# Patient Record
Sex: Female | Born: 1995 | Race: White | Hispanic: No | Marital: Single | Smoking: Never smoker
Health system: Southern US, Community
[De-identification: ages and names within clinical notes are randomized; demographics above are authoritative.]

---

## 2017-03-04 ENCOUNTER — Encounter: Payer: Self-pay | Admitting: Emergency Medicine

## 2017-03-04 ENCOUNTER — Other Ambulatory Visit: Payer: Self-pay

## 2017-03-04 ENCOUNTER — Emergency Department
Admission: EM | Admit: 2017-03-04 | Discharge: 2017-03-04 | Disposition: A | Discharge status: Home / Self Care | Payer: Medicaid Other | Source: Home / Self Care | Attending: Family Medicine | Admitting: Family Medicine

## 2017-03-04 DIAGNOSIS — Z3201 Encounter for pregnancy test, result positive: Secondary | ICD-10-CM

## 2017-03-04 LAB — POCT URINE PREGNANCY: PREG TEST UR: POSITIVE — AB

## 2017-03-04 NOTE — ED Provider Notes (Signed)
[facility]    CSN: [id number] Arrival date & time: 03/04/17  1551     History   Chief Complaint Chief Complaint  Patient presents with  . Possible Pregnancy    HPI Sherry Klein is a 21 y.o. female.   Patient has had three positive pregnancy tests at home and wants to confirm.  Patient's last menstrual period was 01/26/2017 (exact date).  She denies abdominal pain, vaginal discharge, vaginal bleeding, fever, nausea, etc.   The history is provided by the patient.    History reviewed. No pertinent past medical history.  There are no active problems to display for this patient.   History reviewed. No pertinent surgical history.  OB History    Gravida Para Term Preterm AB Living   1             SAB TAB Ectopic Multiple Live Births                   Home Medications    Prior to Admission medications   Not on File    Family History No family history on file.  Social History Social History   Tobacco Use  . Smoking status: Never Smoker  . Smokeless tobacco: Never Used  Substance Use Topics  . Alcohol use: No    Frequency: Never  . Drug use: No     Allergies   Patient has no known allergies.   Review of Systems Review of Systems  Constitutional: Negative for activity change, appetite change, chills, diaphoresis, fatigue and fever.  HENT: Negative.   Eyes: Negative.   Respiratory: Negative.   Cardiovascular: Negative.   Gastrointestinal: Negative for abdominal pain, nausea and vomiting.  Genitourinary: Negative.   Musculoskeletal: Negative.   Skin: Negative.   Neurological: Negative for headaches.     Physical Exam Triage Vital Signs ED Triage Vitals  Enc Vitals Group     BP 03/04/17 1617 105/70     Pulse Rate 03/04/17 1617 73     Resp --      Temp 03/04/17 1617 98.2 F (36.8 C)     Temp Source 03/04/17 1617 Oral     SpO2 03/04/17 1617 100 %     Weight 03/04/17 1618 116 lb (52.6 kg)     Height 03/04/17 1618 4\' 11"   (1.499 m)     Head Circumference --      Peak Flow --      Pain Score 03/04/17 1618 0     Pain Loc --      Pain Edu? --      Excl. in [place]? --    No data found.  Updated Vital Signs BP 105/70 (BP Location: Right Arm)   Pulse 73   Temp 98.2 F (36.8 C) (Oral)   Ht 4\' 11"  (1.499 m)   Wt 116 lb (52.6 kg)   LMP 01/26/2017 (Exact Date)   SpO2 100%   BMI 23.43 kg/m   Visual Acuity Right Eye Distance:   Left Eye Distance:   Bilateral Distance:    Right Eye Near:   Left Eye Near:    Bilateral Near:     Physical Exam Nursing notes and Vital Signs reviewed. Appearance:  Patient appears stated age, and in no acute distress.    Eyes:  Pupils are equal, round, and reactive to light and accomodation.  Extraocular movement is intact.  Conjunctivae are not inflamed   Pharynx:  Normal; moist mucous membranes  Neck:  Supple.  No adenopathy Lungs:  Clear to auscultation.  Breath sounds are equal.  Moving air well. Heart:  Regular rate and rhythm without murmurs, rubs, or gallops.  Abdomen:  Nontender without masses or hepatosplenomegaly.  Bowel sounds are present.  No CVA or flank tenderness.  Extremities:  No edema.  Skin:  No rash present.     UC Treatments / Results  Labs (all labs ordered are listed, but only abnormal results are displayed) Labs Reviewed  POCT URINE PREGNANCY - Abnormal; Notable for the following components:      Result Value   Preg Test, Ur Positive (*)    All other components within normal limits  HCG, QUANTITATIVE, PREGNANCY    EKG  EKG Interpretation None       Radiology No results found.  Procedures Procedures (including critical care time)  Medications Ordered in UC Medications - No data to display   Initial Impression / Assessment and Plan / UC Course  I have reviewed the triage vital signs and the nursing notes.  Pertinent labs & imaging results that were available during my care of the patient were reviewed by me and considered in my  medical decision making (see chart for details).    Quantitative HCG pending. Followup with OBGYN to establish care.    Final Clinical Impressions(s) / UC Diagnoses   Final diagnoses:  Positive pregnancy test    ED Discharge Orders    None           Kandra Nicolas, MD 03/06/17 [phone]

## 2017-03-04 NOTE — ED Triage Notes (Signed)
Pregnancy test, has 3 positive tests at home, wants to know how far. Had unprotected sex.

## 2017-03-05 ENCOUNTER — Telehealth: Payer: Self-pay

## 2017-03-05 LAB — HCG, QUANTITATIVE, PREGNANCY: HCG, Total, QN: 3034 m[IU]/mL

## 2017-03-05 NOTE — Telephone Encounter (Signed)
Notified patient of lab results 

## 2017-04-13 ENCOUNTER — Encounter: Payer: Self-pay | Admitting: Obstetrics & Gynecology

## 2017-04-13 ENCOUNTER — Other Ambulatory Visit (HOSPITAL_COMMUNITY)
Admission: RE | Admit: 2017-04-13 | Discharge: 2017-04-13 | Disposition: A | Discharge status: Home / Self Care | Payer: Medicaid Other | Source: Ambulatory Visit | Attending: Obstetrics & Gynecology | Admitting: Obstetrics & Gynecology

## 2017-04-13 ENCOUNTER — Ambulatory Visit (INDEPENDENT_AMBULATORY_CARE_PROVIDER_SITE_OTHER): Payer: Medicaid Other | Admitting: Obstetrics & Gynecology

## 2017-04-13 VITALS — BP 108/64 | HR 77 | Wt 120.0 lb

## 2017-04-13 DIAGNOSIS — Z3400 Encounter for supervision of normal first pregnancy, unspecified trimester: Secondary | ICD-10-CM | POA: Diagnosis present

## 2017-04-13 DIAGNOSIS — Z3401 Encounter for supervision of normal first pregnancy, first trimester: Secondary | ICD-10-CM | POA: Diagnosis not present

## 2017-04-13 DIAGNOSIS — Z3A00 Weeks of gestation of pregnancy not specified: Secondary | ICD-10-CM | POA: Diagnosis not present

## 2017-04-13 DIAGNOSIS — Z3687 Encounter for antenatal screening for uncertain dates: Secondary | ICD-10-CM | POA: Diagnosis not present

## 2017-04-13 DIAGNOSIS — N838 Other noninflammatory disorders of ovary, fallopian tube and broad ligament: Secondary | ICD-10-CM

## 2017-04-13 NOTE — Progress Notes (Signed)
Bedside U/S shows single IUP with FHT of 178 BPM  CRL is 40.57mm  GA is 11w

## 2017-04-14 NOTE — Progress Notes (Signed)
  Subjective:    Sherry Klein is a G1P0 [redacted]w[redacted]d being seen today for her first obstetrical visit.  Her obstetrical history is significant for unintended pregnancy. Patient does intend to breast feed. Pregnancy history fully reviewed.  Patient reports no complaints.  Vitals:   04/13/17 1525  BP: 108/64  Pulse: 77  Weight: 120 lb (54.4 kg)    HISTORY: OB History  Gravida Para Term Preterm AB Living  1            SAB TAB Ectopic Multiple Live Births               # Outcome Date GA Lbr Len/2nd Weight Sex Delivery Anes PTL Lv  1 Current              History reviewed. No pertinent past medical history. History reviewed. No pertinent surgical history. Family History  Problem Relation Age of Onset  . Diabetes Maternal Grandmother   . Heart failure Maternal Grandfather      Exam    Uterus:     Pelvic Exam:    Perineum: No Hemorrhoids   Vulva: normal   Vagina:  normal mucosa   pH: n/a   Cervix: no lesions   Adnexa: normal adnexa   Bony Pelvis: average  System: Breast:  normal appearance, no masses or tenderness   Skin: normal coloration and turgor, no rashes    Neurologic: oriented   Extremities: no deformities   HEENT extra ocular movement intact, sclera clear, anicteric, oropharynx clear, no lesions, neck supple with midline trachea, thyroid without masses and trachea midline   Mouth/Teeth mucous membranes moist, pharynx normal without lesions and dental hygiene good   Neck supple and no masses   Cardiovascular: regular rate and rhythm   Respiratory:  appears well, vitals normal, no respiratory distress, acyanotic, normal RR, chest clear, no wheezing, crepitations, rhonchi, normal symmetric air entry   Abdomen: soft, non-tender; bowel sounds normal; no masses,  no organomegaly   Urinary: urethral meatus normal      Assessment:    Pregnancy: G1P0 at [redacted] weeks EGA   Plan:     Initial labs drawn. Prenatal vitamins. Problem list reviewed and updated. Refused  all genetic screening  Ultrasound discussed; fetal survey: requested.  Follow up in 4 weeks. [facility]--App only   Silas [place] 04/14/2017

## 2017-04-15 LAB — URINE CULTURE, OB REFLEX: Organism ID, Bacteria: NO GROWTH

## 2017-04-15 LAB — CULTURE, OB URINE

## 2017-04-16 ENCOUNTER — Ambulatory Visit (HOSPITAL_COMMUNITY)
Admission: RE | Admit: 2017-04-16 | Discharge: 2017-04-16 | Disposition: A | Discharge status: Home / Self Care | Payer: Medicaid Other | Source: Ambulatory Visit | Attending: Obstetrics & Gynecology | Admitting: Obstetrics & Gynecology

## 2017-04-16 DIAGNOSIS — Z3A11 11 weeks gestation of pregnancy: Secondary | ICD-10-CM | POA: Insufficient documentation

## 2017-04-16 DIAGNOSIS — O3481 Maternal care for other abnormalities of pelvic organs, first trimester: Secondary | ICD-10-CM | POA: Insufficient documentation

## 2017-04-16 DIAGNOSIS — N838 Other noninflammatory disorders of ovary, fallopian tube and broad ligament: Secondary | ICD-10-CM

## 2017-04-16 DIAGNOSIS — Z3400 Encounter for supervision of normal first pregnancy, unspecified trimester: Secondary | ICD-10-CM

## 2017-04-16 DIAGNOSIS — Z3401 Encounter for supervision of normal first pregnancy, first trimester: Secondary | ICD-10-CM | POA: Insufficient documentation

## 2017-04-16 DIAGNOSIS — N83202 Unspecified ovarian cyst, left side: Secondary | ICD-10-CM | POA: Diagnosis not present

## 2017-04-16 LAB — GC/CHLAMYDIA PROBE AMP (~~LOC~~) NOT AT ARMC
CHLAMYDIA, DNA PROBE: NEGATIVE
NEISSERIA GONORRHEA: NEGATIVE

## 2017-04-20 LAB — OBSTETRIC PANEL
Antibody Screen: NOT DETECTED
Basophils Absolute: 40 cells/uL (ref 0–200)
Basophils Relative: 0.5 %
EOS ABS: 79 {cells}/uL (ref 15–500)
Eosinophils Relative: 1 %
HCT: 36.4 % (ref 35.0–45.0)
HEMOGLOBIN: 12.3 g/dL (ref 11.7–15.5)
Hepatitis B Surface Ag: NONREACTIVE
Lymphs Abs: 1912 cells/uL (ref 850–3900)
MCH: 27.3 pg (ref 27.0–33.0)
MCHC: 33.8 g/dL (ref 32.0–36.0)
MCV: 80.9 fL (ref 80.0–100.0)
MPV: 10.3 fL (ref 7.5–12.5)
Monocytes Relative: 6.9 %
Neutro Abs: 5325 cells/uL (ref 1500–7800)
Neutrophils Relative %: 67.4 %
Platelets: 285 10*3/uL (ref 140–400)
RBC: 4.5 10*6/uL (ref 3.80–5.10)
RDW: 14.7 % (ref 11.0–15.0)
RPR: NONREACTIVE
RUBELLA: 1.1 {index}
TOTAL LYMPHOCYTE: 24.2 %
WBC: 7.9 10*3/uL (ref 3.8–10.8)
WBCMIX: 545 {cells}/uL (ref 200–950)

## 2017-04-20 LAB — CYSTIC FIBROSIS DIAGNOSTIC STUDY

## 2017-04-20 LAB — HIV ANTIBODY (ROUTINE TESTING W REFLEX): HIV: NONREACTIVE

## 2017-04-20 LAB — SICKLE CELL SCREEN: SICKLE SOLUBILITY TEST - HGBRFX: NEGATIVE

## 2017-04-21 ENCOUNTER — Encounter: Payer: Self-pay | Admitting: Obstetrics & Gynecology

## 2017-04-21 DIAGNOSIS — Z3400 Encounter for supervision of normal first pregnancy, unspecified trimester: Secondary | ICD-10-CM | POA: Insufficient documentation

## 2017-04-29 ENCOUNTER — Encounter: Payer: Self-pay | Admitting: Obstetrics & Gynecology

## 2017-04-29 DIAGNOSIS — D271 Benign neoplasm of left ovary: Secondary | ICD-10-CM | POA: Insufficient documentation

## 2017-05-13 ENCOUNTER — Ambulatory Visit (INDEPENDENT_AMBULATORY_CARE_PROVIDER_SITE_OTHER): Payer: Medicaid Other | Admitting: Obstetrics & Gynecology

## 2017-05-13 VITALS — BP 101/55 | HR 86 | Wt 125.0 lb

## 2017-05-13 DIAGNOSIS — Z3402 Encounter for supervision of normal first pregnancy, second trimester: Secondary | ICD-10-CM | POA: Diagnosis not present

## 2017-05-13 DIAGNOSIS — Z3400 Encounter for supervision of normal first pregnancy, unspecified trimester: Secondary | ICD-10-CM

## 2017-05-13 NOTE — Progress Notes (Signed)
   PRENATAL VISIT NOTE  Subjective:  Sherry Klein is a 22 y.o. G1P0 at [redacted]w[redacted]d being seen today for ongoing prenatal care.  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  Patient reports She has a neck ache. I rec'd IBU prior to 28 weeks and Tylenol anytime prn..  Contractions: Not present. Vag. Bleeding: None.  Movement: Absent. Denies leaking of fluid.   The following portions of the patient's history were reviewed and updated as appropriate: allergies, current medications, past family history, past medical history, past social history, past surgical history and problem list. Problem list updated.  Objective:   Vitals:   05/13/17 1538  BP: (!) 101/55  Pulse: 86  Weight: 125 lb (56.7 kg)    Fetal Status: Fetal Heart Rate (bpm): 154   Movement: Absent     General:  Alert, oriented and cooperative. Patient is in no acute distress.  Skin: Skin is warm and dry. No rash noted.   Cardiovascular: Normal heart rate noted  Respiratory: Normal respiratory effort, no problems with respiration noted  Abdomen: Soft, gravid, appropriate for gestational age.  Pain/Pressure: Absent     Pelvic: Cervical exam deferred        Extremities: Normal range of motion.  Edema: None  Mental Status:  Normal mood and affect. Normal behavior. Normal judgment and thought content.   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 I stressed that this will be followed at every ultrasound she has, that they are generally benign, and that the baby will not be affected by this.  Preterm labor symptoms and general obstetric precautions including but not limited to vaginal bleeding, contractions, leaking of fluid and fetal movement were reviewed in detail with the patient. Please refer to After Visit Summary for other counseling  recommendations.  No Follow-up on file.   Emily Filbert, MD

## 2017-05-13 NOTE — Progress Notes (Signed)
Pt co headaches

## 2017-06-03 ENCOUNTER — Encounter (HOSPITAL_COMMUNITY): Payer: Self-pay | Admitting: Obstetrics & Gynecology

## 2017-06-09 ENCOUNTER — Other Ambulatory Visit: Payer: Self-pay | Admitting: Obstetrics & Gynecology

## 2017-06-09 ENCOUNTER — Ambulatory Visit (HOSPITAL_COMMUNITY)
Admission: RE | Admit: 2017-06-09 | Discharge: 2017-06-09 | Disposition: A | Discharge status: Home / Self Care | Payer: Medicaid Other | Source: Ambulatory Visit | Attending: Obstetrics & Gynecology | Admitting: Obstetrics & Gynecology

## 2017-06-09 DIAGNOSIS — O3482 Maternal care for other abnormalities of pelvic organs, second trimester: Secondary | ICD-10-CM | POA: Diagnosis not present

## 2017-06-09 DIAGNOSIS — Z3A19 19 weeks gestation of pregnancy: Secondary | ICD-10-CM | POA: Insufficient documentation

## 2017-06-09 DIAGNOSIS — Z363 Encounter for antenatal screening for malformations: Secondary | ICD-10-CM

## 2017-06-09 DIAGNOSIS — Z3400 Encounter for supervision of normal first pregnancy, unspecified trimester: Secondary | ICD-10-CM

## 2017-06-09 DIAGNOSIS — D279 Benign neoplasm of unspecified ovary: Secondary | ICD-10-CM | POA: Insufficient documentation

## 2017-06-11 ENCOUNTER — Encounter: Payer: Medicaid Other | Admitting: Advanced Practice Midwife

## 2017-06-14 ENCOUNTER — Ambulatory Visit (INDEPENDENT_AMBULATORY_CARE_PROVIDER_SITE_OTHER): Payer: Medicaid Other

## 2017-06-14 VITALS — BP 102/59 | HR 82 | Wt 129.0 lb

## 2017-06-14 DIAGNOSIS — Z3402 Encounter for supervision of normal first pregnancy, second trimester: Secondary | ICD-10-CM | POA: Diagnosis not present

## 2017-06-14 DIAGNOSIS — N838 Other noninflammatory disorders of ovary, fallopian tube and broad ligament: Secondary | ICD-10-CM

## 2017-06-14 DIAGNOSIS — Z3400 Encounter for supervision of normal first pregnancy, unspecified trimester: Secondary | ICD-10-CM

## 2017-06-14 NOTE — Progress Notes (Signed)
   PRENATAL VISIT NOTE  Subjective:  Sherry Klein is a 22 y.o. G1P0 at [redacted]w[redacted]d being seen today for ongoing prenatal care.  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  Patient reports no complaints.  Contractions: Not present. Vag. Bleeding: None.  Movement: Present. Denies leaking of fluid.   The following portions of the patient's history were reviewed and updated as appropriate: allergies, current medications, past family history, past medical history, past social history, past surgical history and problem list. Problem list updated.  Objective:   Vitals:   06/14/17 1105  BP: (!) 102/59  Pulse: 82  Weight: 129 lb (58.5 kg)    Fetal Status: Fetal Heart Rate (bpm): 154   Movement: Present     General:  Alert, oriented and cooperative. Patient is in no acute distress.  Skin: Skin is warm and dry. No rash noted.   Cardiovascular: Normal heart rate noted  Respiratory: Normal respiratory effort, no problems with respiration noted  Abdomen: Soft, gravid, appropriate for gestational age.  Pain/Pressure: Absent     Pelvic: Cervical exam deferred        Extremities: Normal range of motion.  Edema: None  Mental Status: Normal mood and affect. Normal behavior. Normal judgment and thought content.   Assessment and Plan:  Pregnancy: G1P0 at [redacted]w[redacted]d  1. Supervision of normal first pregnancy, antepartum - No complaints. Routine care - Incomplete views on anatomy scan, repeat ordered - US MFM OB FOLLOW UP; Future  2. Ovarian mass, left - Will reevaluate with follow up scan  Preterm labor symptoms and general obstetric precautions including but not limited to vaginal bleeding, contractions, leaking of fluid and fetal movement were reviewed in detail with the patient. Please refer to After Visit Summary for other counseling recommendations.  Return in  about 1 month (around 07/12/2017) for Return OB visit.  Wende Mott, CNM  06/14/17 11:15 AM

## 2017-06-30 ENCOUNTER — Ambulatory Visit (HOSPITAL_COMMUNITY)
Admission: RE | Admit: 2017-06-30 | Discharge: 2017-06-30 | Disposition: A | Discharge status: Home / Self Care | Payer: Medicaid Other | Source: Ambulatory Visit

## 2017-06-30 DIAGNOSIS — Z362 Encounter for other antenatal screening follow-up: Secondary | ICD-10-CM | POA: Diagnosis present

## 2017-06-30 DIAGNOSIS — O3482 Maternal care for other abnormalities of pelvic organs, second trimester: Secondary | ICD-10-CM | POA: Diagnosis not present

## 2017-06-30 DIAGNOSIS — Z0489 Encounter for examination and observation for other specified reasons: Secondary | ICD-10-CM | POA: Insufficient documentation

## 2017-06-30 DIAGNOSIS — Z3A22 22 weeks gestation of pregnancy: Secondary | ICD-10-CM | POA: Insufficient documentation

## 2017-06-30 DIAGNOSIS — D279 Benign neoplasm of unspecified ovary: Secondary | ICD-10-CM | POA: Diagnosis not present

## 2017-06-30 DIAGNOSIS — IMO0002 Reserved for concepts with insufficient information to code with codable children: Secondary | ICD-10-CM | POA: Insufficient documentation

## 2017-06-30 DIAGNOSIS — N838 Other noninflammatory disorders of ovary, fallopian tube and broad ligament: Secondary | ICD-10-CM | POA: Insufficient documentation

## 2017-06-30 DIAGNOSIS — Z3400 Encounter for supervision of normal first pregnancy, unspecified trimester: Secondary | ICD-10-CM

## 2017-07-12 ENCOUNTER — Encounter: Payer: Medicaid Other | Admitting: Advanced Practice Midwife

## 2017-07-12 ENCOUNTER — Telehealth: Payer: Self-pay | Admitting: *Deleted

## 2017-07-12 NOTE — Telephone Encounter (Signed)
Called patient to see why she didn't show and to try to reschedule. Non working number.

## 2018-01-25 ENCOUNTER — Encounter (HOSPITAL_COMMUNITY): Payer: Self-pay

## 2018-08-02 IMAGING — US US OB COMP LESS 14 WK
1 series · 15 of 28 positions shown · non-contrast
Comparison: None

CLINICAL DATA: Dating, mass

EXAM:
OBSTETRIC <14 WK ULTRASOUND
TECHNIQUE: Transabdominal ultrasound was performed for evaluation of the
gestation as well as the maternal uterus and adnexal regions.

[Series 1: us ob comp less 14 wk · 46 acquisitions, 15 frames shown]
[im 1/46]
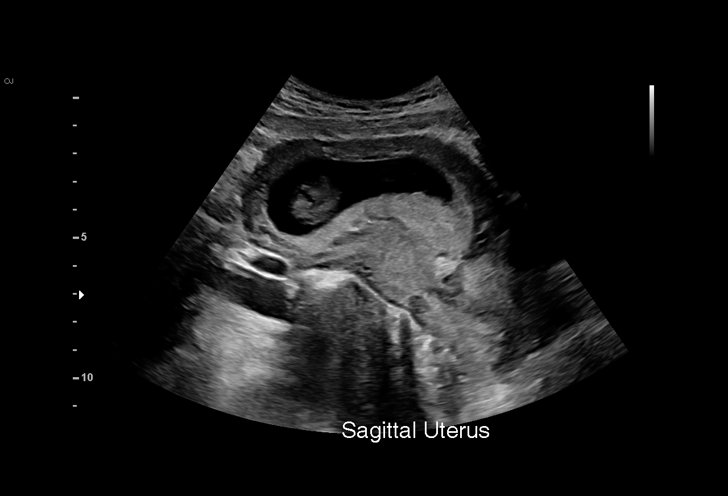
[im 4/46]
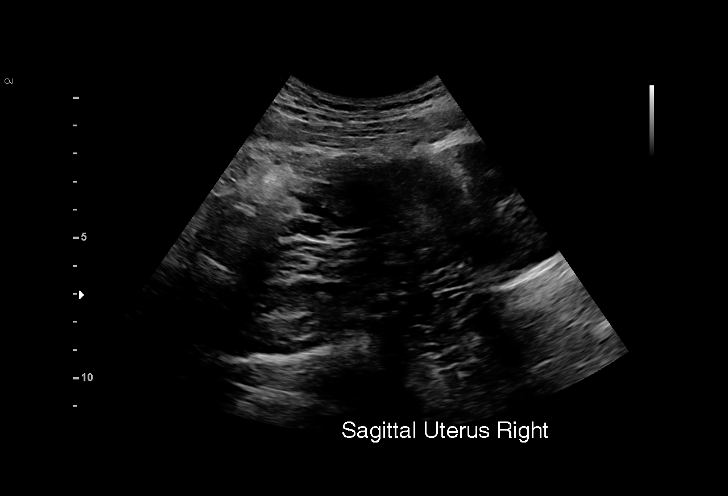
[im 7/46]
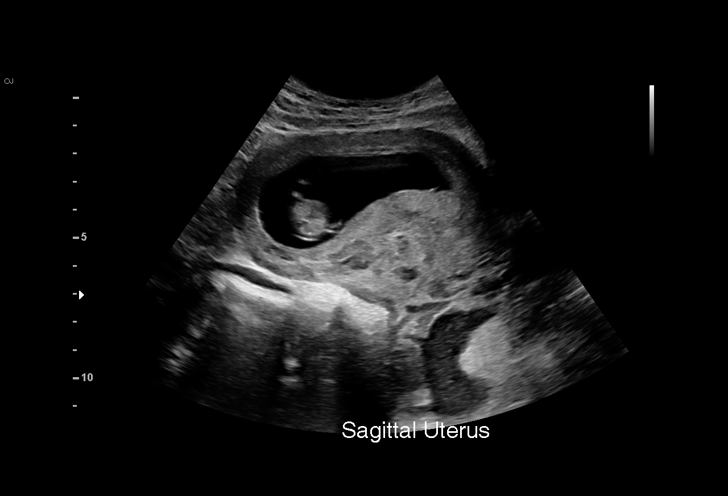
[im 11/46]
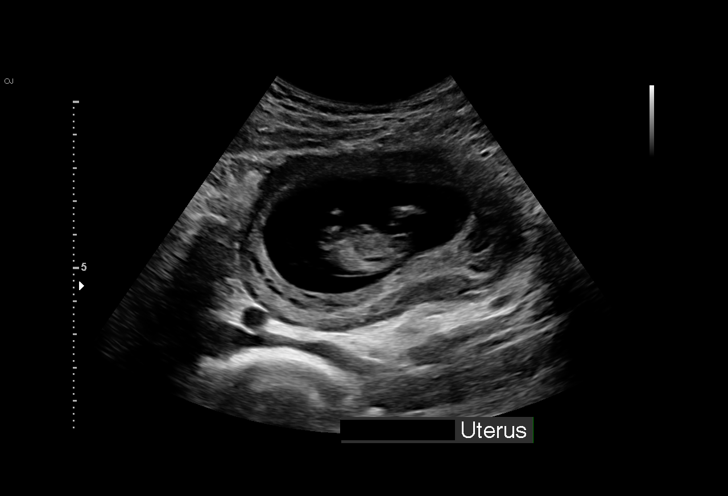
[im 14/46]
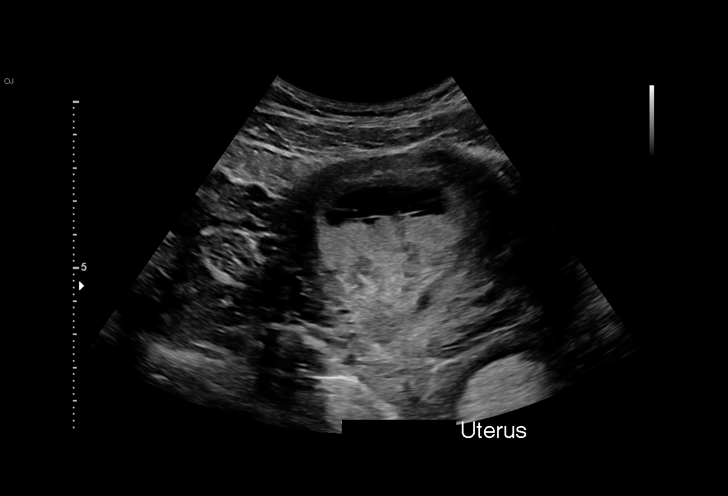
[im 17/46]
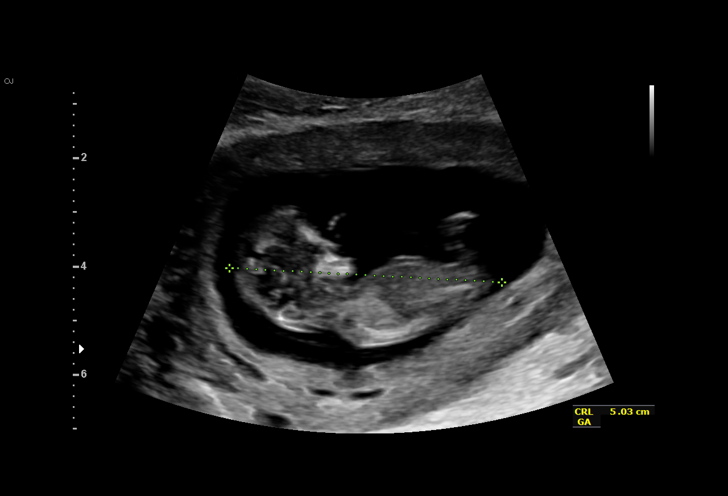
[im 21/46]
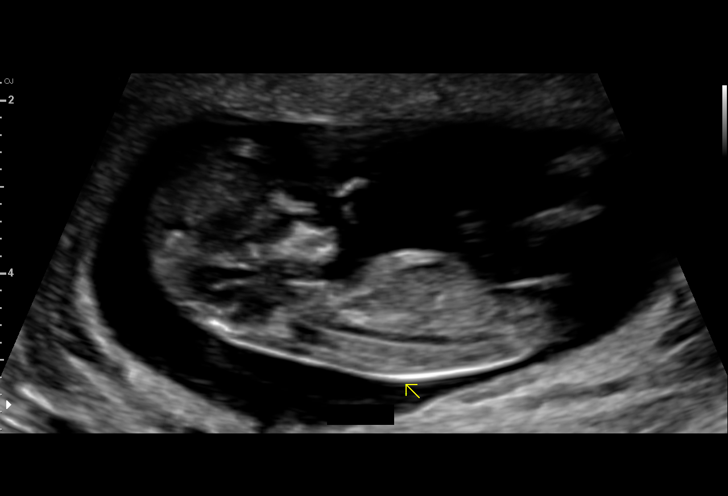
[im 24/46]
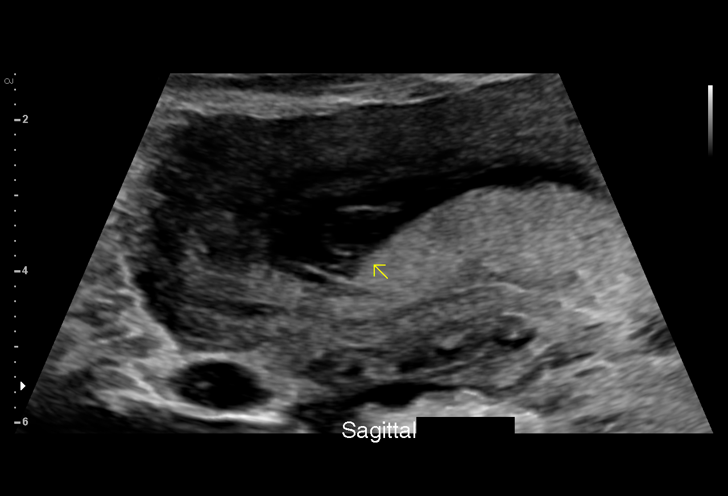
[im 26/46]
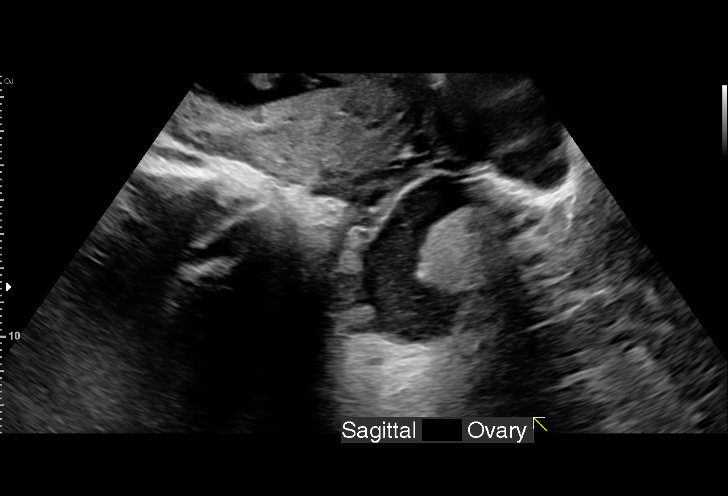
[im 29/46]
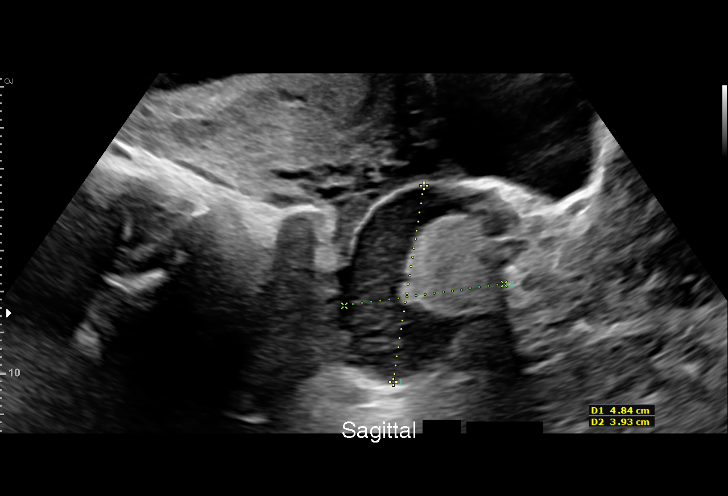
[im 32/46]
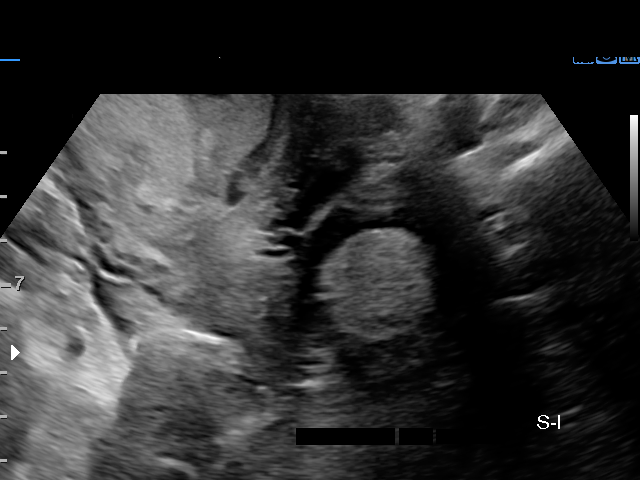
[im 36/46]
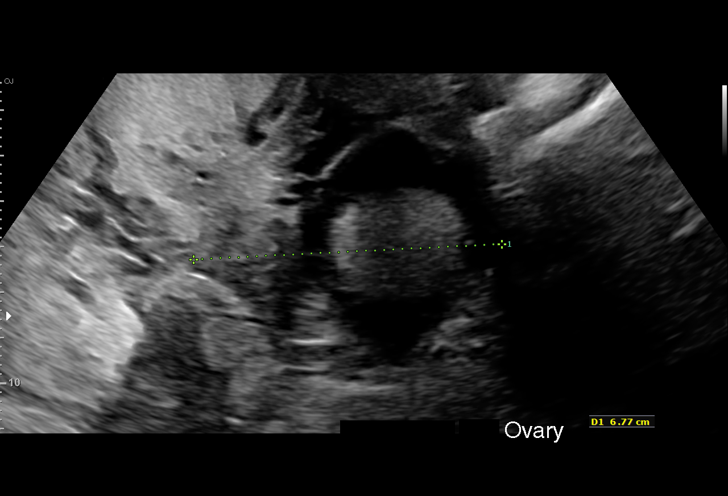
[im 39/46]
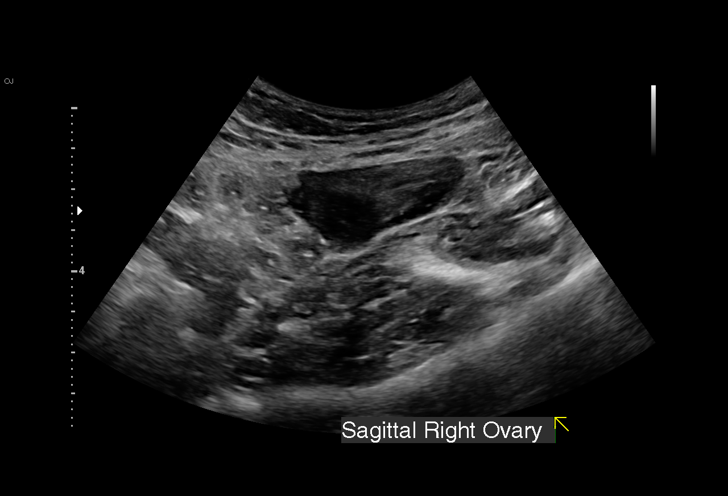
[im 42/46]
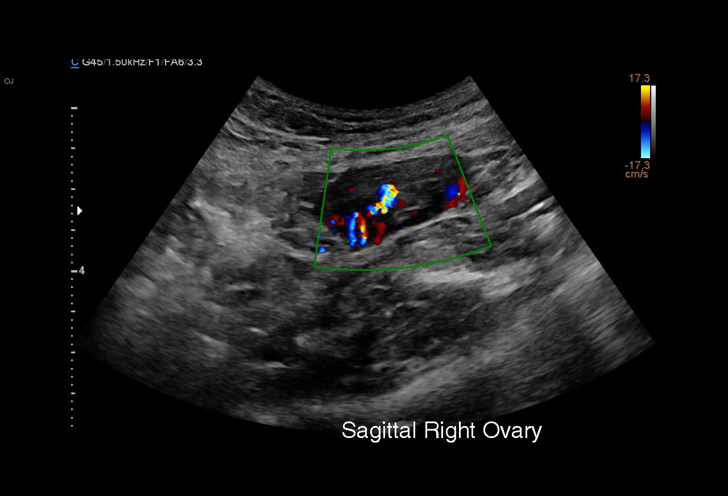
[im 46/46]
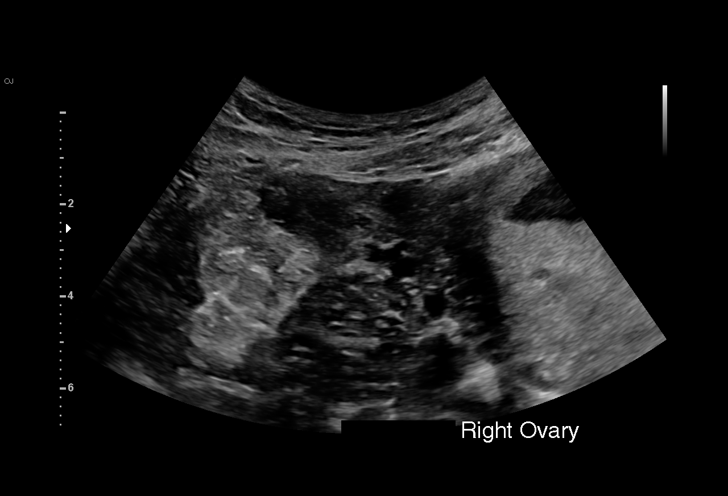

[15 of 28 positions shown; findings below may reference images not displayed]

FINDINGS: Intrauterine gestational sac: Present

Yolk sac:  Present

Embryo:  Present

Cardiac Activity: Present

Heart Rate: 171 bpm

CRL:   50.1 mm   11 w 5 d                  US EDC: 10/31/2017

Subchorionic hemorrhage:  None visualized.

Maternal uterus/adnexae:

RIGHT ovary unremarkable.

LEFT ovarian mass measuring 4.8 x 3.9 x 3.8 cm, demonstrating cystic
and hyperechoic components, question dermoid tumor.

No other adnexal masses or free pelvic fluid.
IMPRESSION: Single live intrauterine gestation at 11 weeks 5 days EGA.

Probable dermoid tumor LEFT ovary, can reassess on followup imaging.

## 2022-10-14 ENCOUNTER — Ambulatory Visit
Admission: EM | Admit: 2022-10-14 | Discharge: 2022-10-14 | Disposition: A | Discharge status: Home / Self Care | Payer: Medicaid Other | Attending: Family Medicine | Admitting: Family Medicine

## 2022-10-14 ENCOUNTER — Other Ambulatory Visit: Payer: Self-pay

## 2022-10-14 DIAGNOSIS — L239 Allergic contact dermatitis, unspecified cause: Secondary | ICD-10-CM | POA: Diagnosis not present

## 2022-10-14 MED ORDER — METHYLPREDNISOLONE ACETATE 80 MG/ML IJ SUSP
80.0000 mg | Freq: Once | INTRAMUSCULAR | Status: AC
  Administered 2022-10-14: 80 mg via INTRAMUSCULAR

## 2022-10-14 MED ORDER — HYDROXYZINE HCL 25 MG PO TABS: 25.0000 mg | ORAL_TABLET | Freq: Four times a day (QID) | ORAL | 0 refills | Status: AC | PRN

## 2022-10-14 NOTE — ED Provider Notes (Signed)
[facility]    CSN: [id number] Arrival date & time: 10/14/22  1427      History   Chief Complaint Chief Complaint  Patient presents with   Rash    HPI Sherry Klein is a 27 y.o. female.   HPI  Patient has a rash.  She was seen by an urgent care center a few days ago.  She was given a Medrol Dosepak, Allegra, and Septra.  She states the rash is spreading.  She still having a lot of itching.  She is here for reevaluation She states she was not outdoors.  She does not know what caused the rash  History reviewed. No pertinent past medical history.  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 No pertinent surgical history.  OB History     Gravida  1   Para      Term      Preterm      AB      Living         SAB      IAB      Ectopic      Multiple      Live Births               Home Medications    Prior to Admission medications   Medication Sig Start Date End Date Taking? Authorizing Provider  fexofenadine (ALLEGRA) 180 MG tablet Take 180 mg by mouth daily.   Yes [provider]  hydrOXYzine (ATARAX) 25 MG tablet Take 1-2 tablets (25-50 mg total) by mouth every 6 (six) hours as needed for itching. 10/14/22  Yes Eustace Moore, MD  methylPREDNISolone (MEDROL DOSEPAK) 4 MG TBPK tablet Take by mouth.   Yes [provider]  sulfamethoxazole-trimethoprim (BACTRIM) 400-80 MG tablet Take 1 tablet by mouth 2 (two) times daily.   Yes [provider]  Prenatal Vit-Fe Fumarate-FA (PRENATAL MULTIVITAMIN) TABS tablet Take 1 tablet by mouth daily at 12 noon.    [provider]    Family History Family History  Problem Relation Age of Onset   Diabetes Maternal Grandmother    Heart  failure Maternal Grandfather     Social History Social History   Tobacco Use   Smoking status: Never   Smokeless tobacco: Never  Vaping Use   Vaping status: Never Used  Substance Use Topics   Alcohol use: No   Drug use: No     Allergies   Patient has no known allergies.   Review of Systems Review of Systems See HPI  Physical Exam Triage Vital Signs ED Triage Vitals [10/14/22 1438]  Encounter Vitals Group     BP 130/79     Systolic BP Percentile      Diastolic BP Percentile      Pulse Rate 70     Resp 16     Temp 97.6 F (36.4 C)     Temp Source Oral     SpO2 98 %     Weight      Height      Head Circumference      Peak Flow      Pain Score 2     Pain Loc  Pain Education      Exclude from Growth Chart    No data found.  Updated Vital Signs BP 130/79 (BP Location: Left Arm)   Pulse 70   Temp 97.6 F (36.4 C) (Oral)   Resp 16   SpO2 98%   Visual Acuity     Physical Exam Constitutional:      General: She is not in acute distress.    Appearance: She is well-developed.     Comments: Pleasant, overweight  HENT:     Head: Normocephalic and atraumatic.  Eyes:     Conjunctiva/sclera: Conjunctivae normal.     Pupils: Pupils are equal, round, and reactive to light.  Cardiovascular:     Rate and Rhythm: Normal rate.  Pulmonary:     Effort: Pulmonary effort is normal. No respiratory distress.  Abdominal:     General: There is no distension.     Palpations: Abdomen is soft.  Musculoskeletal:        General: Normal range of motion.     Cervical back: Normal range of motion.  Skin:    General: Skin is warm and dry.     Findings: Rash present.     Comments: Patient has scattered rash on her thighs and her lower arms.  There are multiple linear clusters of vesicles that are typical for poison ivy/contact dermatitis.  There is a confluent rash around her neck that looks more like urticaria/eczema.  It is covered with calamine.  Neurological:      General: No focal deficit present.     Mental Status: She is alert.      UC Treatments / Results  Labs (all labs ordered are listed, but only abnormal results are displayed) Labs Reviewed - No data to display  EKG   Radiology No results found.  Procedures Procedures (including critical care time)  Medications Ordered in UC Medications  methylPREDNISolone acetate (DEPO-MEDROL) injection 80 mg (80 mg Intramuscular Given 10/14/22 1530)    Initial Impression / Assessment and Plan / UC Course  I have reviewed the triage vital signs and the nursing notes.  Pertinent labs & imaging results that were available during my care of the patient were reviewed by me and considered in my medical decision making (see chart for details).     This looks like an allergic inflammatory rash, likely poison ivy or contact dermatitis type Final Clinical Impressions(s) / UC Diagnoses   Final diagnoses:  Allergic contact dermatitis, unspecified trigger     Discharge Instructions      Take the hydroxyzine as directed.  This will help itching,  it is good at night but might make you drowsy during day Take the allegra ( fexofenadine) daily in the morning Finish medrol Dosepak Stay cool See your doctor if not improving by Monday   ED Prescriptions     Medication Sig Dispense Auth. Provider   hydrOXYzine (ATARAX) 25 MG tablet Take 1-2 tablets (25-50 mg total) by mouth every 6 (six) hours as needed for itching. 20 tablet Eustace Moore, MD      PDMP not reviewed this encounter.   Eustace Moore, MD 10/14/22 [phone]

## 2022-10-14 NOTE — ED Triage Notes (Signed)
C/o rash to arms since Friday, last night started spreading to neck and legs. Reports was seen for same at another UC on Saturday.

## 2022-10-14 NOTE — Discharge Instructions (Signed)
Take the hydroxyzine as directed.  This will help itching,  it is good at night but might make you drowsy during day Take the allegra ( fexofenadine) daily in the morning Finish medrol Dosepak Stay cool See your doctor if not improving by Monday
